# Patient Record
Sex: Male | Born: 1985 | Hispanic: Yes | Marital: Single | Smoking: Current some day smoker
Health system: Southern US, Community
[De-identification: ages and names within clinical notes are randomized; demographics above are authoritative.]

## PROBLEM LIST (undated history)

## (undated) DIAGNOSIS — I10 Essential (primary) hypertension: Secondary | ICD-10-CM

## (undated) DIAGNOSIS — E7800 Pure hypercholesterolemia, unspecified: Secondary | ICD-10-CM

---

## 2018-05-31 ENCOUNTER — Other Ambulatory Visit: Payer: Self-pay

## 2018-05-31 ENCOUNTER — Emergency Department (HOSPITAL_BASED_OUTPATIENT_CLINIC_OR_DEPARTMENT_OTHER): Payer: Self-pay

## 2018-05-31 ENCOUNTER — Encounter (HOSPITAL_BASED_OUTPATIENT_CLINIC_OR_DEPARTMENT_OTHER): Payer: Self-pay | Admitting: *Deleted

## 2018-05-31 DIAGNOSIS — F17200 Nicotine dependence, unspecified, uncomplicated: Secondary | ICD-10-CM | POA: Insufficient documentation

## 2018-05-31 DIAGNOSIS — I10 Essential (primary) hypertension: Secondary | ICD-10-CM | POA: Insufficient documentation

## 2018-05-31 DIAGNOSIS — R002 Palpitations: Secondary | ICD-10-CM | POA: Insufficient documentation

## 2018-05-31 NOTE — ED Triage Notes (Signed)
Pt reports palpitations, chest pain, dizziness, SOB x 1 week. Pt alert. NAD. Using [place] interpreter [phone] for triage

## 2018-06-01 ENCOUNTER — Emergency Department (HOSPITAL_BASED_OUTPATIENT_CLINIC_OR_DEPARTMENT_OTHER): Payer: Self-pay

## 2018-06-01 ENCOUNTER — Emergency Department (HOSPITAL_BASED_OUTPATIENT_CLINIC_OR_DEPARTMENT_OTHER)
Admission: EM | Admit: 2018-06-01 | Discharge: 2018-06-01 | Disposition: A | Discharge status: Home / Self Care | Payer: Self-pay | Attending: Emergency Medicine | Admitting: Emergency Medicine

## 2018-06-01 DIAGNOSIS — R002 Palpitations: Secondary | ICD-10-CM

## 2018-06-01 DIAGNOSIS — I10 Essential (primary) hypertension: Secondary | ICD-10-CM

## 2018-06-01 HISTORY — DX: Pure hypercholesterolemia, unspecified: E78.00

## 2018-06-01 LAB — CBC
HCT: 46.2 % (ref 39.0–52.0)
HEMOGLOBIN: 16.5 g/dL (ref 13.0–17.0)
MCH: 34.7 pg — ABNORMAL HIGH (ref 26.0–34.0)
MCHC: 35.7 g/dL (ref 30.0–36.0)
MCV: 97.1 fL (ref 78.0–100.0)
Platelets: 251 10*3/uL (ref 150–400)
RBC: 4.76 MIL/uL (ref 4.22–5.81)
RDW: 12.6 % (ref 11.5–15.5)
WBC: 7.1 10*3/uL (ref 4.0–10.5)

## 2018-06-01 LAB — BASIC METABOLIC PANEL
ANION GAP: 11 (ref 5–15)
BUN: 10 mg/dL (ref 6–20)
CALCIUM: 9.1 mg/dL (ref 8.9–10.3)
CO2: 26 mmol/L (ref 22–32)
Chloride: 102 mmol/L (ref 98–111)
Creatinine, Ser: 1.02 mg/dL (ref 0.61–1.24)
GFR calc Af Amer: >60 mL/min (ref >60)
GLUCOSE: 128 mg/dL — AB (ref 70–99)
Potassium: 3.8 mmol/L (ref 3.5–5.1)
SODIUM: 139 mmol/L (ref 135–145)

## 2018-06-01 LAB — TROPONIN I
TROPONIN I: 0.04 ng/mL — AB (ref <0.03)
TROPONIN I: 0.04 ng/mL — AB (ref <0.03)

## 2018-06-01 LAB — TSH: TSH: 3.405 u[IU]/mL (ref 0.350–4.500)

## 2018-06-01 MED ORDER — IOPAMIDOL (ISOVUE-370) INJECTION 76%
100.0000 mL | Freq: Once | INTRAVENOUS | Status: AC | PRN
  Administered 2018-06-01: 100 mL via INTRAVENOUS

## 2018-06-01 MED ORDER — MORPHINE SULFATE (PF) 4 MG/ML IV SOLN
4.0000 mg | Freq: Once | INTRAVENOUS | Status: AC
  Administered 2018-06-01: 4 mg via INTRAVENOUS
  Filled 2018-06-01: qty 1

## 2018-06-01 MED ORDER — ONDANSETRON HCL 4 MG/2ML IJ SOLN
4.0000 mg | Freq: Once | INTRAMUSCULAR | Status: AC
  Administered 2018-06-01: 4 mg via INTRAVENOUS
  Filled 2018-06-01: qty 2

## 2018-06-01 MED ORDER — HYDROCHLOROTHIAZIDE 25 MG PO TABS: 25.0000 mg | ORAL_TABLET | Freq: Every day | ORAL | 0 refills | Status: DC

## 2018-06-01 NOTE — ED Provider Notes (Signed)
[facility] EMERGENCY DEPARTMENT Provider Note   CSN: [id number] Arrival date & time: 05/31/18  2333     History   Chief Complaint Chief Complaint  Patient presents with  . Palpitations    HPI Peter Villarreal is a 32 y.o. male.  HPI  This is a 32 year old Hispanic male who presents with palpitations.  Patient reports 1 week history of shortness of breath and palpitations.  Seems to come and go.  Nothing seems to make it better or worse.  Patient does report some increase caffeine use.  Denies any drug or alcohol abuse.  Specifically denies cocaine use.  Denies any recent travel or hospitalization.  No history of blood clots or leg swelling.  States palpitations are over the left side of the chest.  He occasionally has some left arm numbness.  Reports history of hyperlipidemia.  No history of diabetes, smoking, early family history of heart disease.  No known thyroid disorders.  She does report some residual palpitations at this time.  History taken with Spanish interpreter.  Past Medical History:  Diagnosis Date  . High cholesterol     There are no active problems to display for this patient.   History reviewed. No pertinent surgical history.      Home Medications    Prior to Admission medications   Medication Sig Start Date End Date Taking? Authorizing Provider  hydrochlorothiazide (HYDRODIURIL) 25 MG tablet Take 1 tablet (25 mg total) by mouth daily. 06/01/18   Neyra Pettie, Mayer Masker, MD    Family History No family history on file.  Social History Social History   Tobacco Use  . Smoking status: Current Some Day Smoker  . Smokeless tobacco: Never Used  Substance Use Topics  . Alcohol use: Yes    Comment: 2x week  . Drug use: Never     Allergies   Patient has no known allergies.   Review of Systems Review of Systems  Constitutional: Negative for fever.  Respiratory: Positive for shortness of breath. Negative for chest tightness.   Cardiovascular:  Positive for palpitations. Negative for chest pain and leg swelling.  Gastrointestinal: Negative for abdominal pain, nausea and vomiting.  Genitourinary: Negative for dysuria.  Neurological: Positive for dizziness.  All other systems reviewed and are negative.    Physical Exam Updated Vital Signs BP (!) 163/125   Pulse 100   Temp 98.5 F (36.9 C)   Resp (!) 21   Ht 1.626 m (5\' 4" )   Wt 99.8 kg   SpO2 100%   BMI 37.76 kg/m   Physical Exam  Constitutional: He is oriented to person, place, and time. He appears well-developed and well-nourished. No distress.  HENT:  Head: Normocephalic and atraumatic.  Eyes: Pupils are equal, round, and reactive to light.  Cardiovascular: Normal rate, regular rhythm and normal heart sounds.  No murmur heard. Pulmonary/Chest: Effort normal and breath sounds normal. No respiratory distress. He has no wheezes.  Abdominal: Soft. Bowel sounds are normal. There is no tenderness. There is no rebound.  Musculoskeletal: He exhibits no edema or tenderness.  Neurological: He is alert and oriented to person, place, and time.  Skin: Skin is warm and dry.  Psychiatric: He has a normal mood and affect.  Nursing note and vitals reviewed.    ED Treatments / Results  Labs (all labs ordered are listed, but only abnormal results are displayed) Labs Reviewed  BASIC METABOLIC PANEL - Abnormal; Notable for the following components:      Result  Value   Glucose, Bld 128 (*)    All other components within normal limits  CBC - Abnormal; Notable for the following components:   MCH 34.7 (*)    All other components within normal limits  TROPONIN I - Abnormal; Notable for the following components:   Troponin I 0.04 (*)    All other components within normal limits  TROPONIN I - Abnormal; Notable for the following components:   Troponin I 0.04 (*)    All other components within normal limits  TSH    EKG EKG Interpretation  Date/Time:  Sunday May 31 2018  23:56:02 EDT Ventricular Rate:  116 PR Interval:  150 QRS Duration: 94 QT Interval:  336 QTC Calculation: 467 R Axis:   -90 Text Interpretation:  Sinus tachycardia Biatrial enlargement Pulmonary disease pattern Left anterior fascicular block Abnormal ECG NO prior for comparison Confirmed by Ross Marcus ([id number]) on 06/01/2018 1:19:06 AM   Radiology Dg Chest 2 View  Result Date: 06/01/2018 CLINICAL DATA:  Chest pain and palpitations EXAM: CHEST - 2 VIEW COMPARISON:  None. FINDINGS: The heart size and mediastinal contours are within normal limits. Both lungs are clear. The visualized skeletal structures are unremarkable. IMPRESSION: Clear lungs. Electronically Signed   By: Deatra Robinson M.D.   On: 06/01/2018 01:12   Ct Angio Chest Pe W And/or Wo Contrast  Result Date: 06/01/2018 CLINICAL DATA:  PE suspected, high pretest prob. Chest pain, shortness of breath, dizziness and palpitation. EXAM: CT ANGIOGRAPHY CHEST WITH CONTRAST TECHNIQUE: Multidetector CT imaging of the chest was performed using the standard protocol during bolus administration of intravenous contrast. Multiplanar CT image reconstructions and MIPs were obtained to evaluate the vascular anatomy. CONTRAST:  ISOVUE-370 IOPAMIDOL (ISOVUE-370) INJECTION 76% COMPARISON:  Radiograph earlier this day. FINDINGS: Cardiovascular: There are no filling defects within the pulmonary arteries to suggest pulmonary embolus. The thoracic aorta is normal in caliber. Cannot assess for dissection given phase of contrast. Heart is normal in size. No pericardial effusion. Mediastinum/Nodes: No enlarged mediastinal or hilar lymph nodes. The esophagus is slightly patulous. No thyroid nodule. Lungs/Pleura: No consolidation, pulmonary edema or pleural fluid. Mild hypoventilatory change dependently. Upper Abdomen: Hepatic steatosis. Musculoskeletal: There are no acute or suspicious osseous abnormalities. Review of the MIP images confirms the above findings.  IMPRESSION: 1. No pulmonary embolus or acute intrathoracic abnormality. 2. Incidental note of hepatic steatosis. Electronically Signed   By: Narda Rutherford M.D.   On: 06/01/2018 02:37    Procedures Procedures (including critical care time)  Medications Ordered in ED Medications  morphine 4 MG/ML injection 4 mg (4 mg Intravenous Given 06/01/18 0148)  ondansetron (ZOFRAN) injection 4 mg (4 mg Intravenous Given 06/01/18 0148)  iopamidol (ISOVUE-370) 76 % injection 100 mL (100 mLs Intravenous Contrast Given 06/01/18 0212)     Initial Impression / Assessment and Plan / ED Course  I have reviewed the triage vital signs and the nursing notes.  Pertinent labs & imaging results that were available during my care of the patient were reviewed by me and considered in my medical decision making (see chart for details).  Clinical Course as of Jun 01 406  Mon Jun 01, 2018  0340 On recheck, patient states he feels much better.  Vital signs reviewed.  Notable for persistent hypertension.   [CH]    Clinical Course User Index [CH] Yazmine Sorey, Mayer Masker, MD    She presents with palpitations and shortness of breath x1 week.  He is overall nontoxic.  Slightly  tachycardic on initial evaluation as well as hypertensive.  Exam is fairly benign.  Denies risk factors for PE.  However, given shortness of breath and tachycardia into the 110s, would need to rule out PE.  He is fairly low risk for ACS.  Risk factors including hypertension and hyperlipidemia.  Heart score of 1 initially.  Initial troponin is marginally elevated at 0.04.  I am not sure that this truly represents ACS.  May be rate dependent.  We will repeat.  Taking into account initial troponin, heart score is 2.  Patient has remained hemodynamically stable in the emergency room.  Her pulse rate varies from 80-1 05.  Thyroid studies are pending.  PET CT scan is negative for acute pulmonary embolus.  Chest x-ray is reassuring.  Repeat troponin is stable at 0.04.   Doubt ACS.  I am reassured with this work-up.  Recommend close follow-up with cardiology for reevaluation.  After history, exam, and medical workup I feel the patient has been appropriately medically screened and is safe for discharge home. Pertinent diagnoses were discussed with the patient. Patient was given return precautions.   Final Clinical Impressions(s) / ED Diagnoses   Final diagnoses:  Palpitations  Essential hypertension    ED Discharge Orders         Ordered    hydrochlorothiazide (HYDRODIURIL) 25 MG tablet  Daily     06/01/18 0355           Shon Baton, MD 06/01/18 0410

## 2018-06-01 NOTE — ED Notes (Signed)
Pt denies any pain at present.Sinus on monitor.  D/c instructions provided in spanish for patient with opportunity to answer questions via interpreter Voiced understanding.

## 2018-06-01 NOTE — ED Notes (Signed)
Report received 

## 2018-06-01 NOTE — Discharge Instructions (Addendum)
You were seen today for palpitations.  Your work-up is largely reassuring.  You need to follow-up with cardiology for definitive evaluation.  Make sure to reduce your caffeine.  Thyroid testing is pending.

## 2018-06-01 NOTE — ED Notes (Signed)
Patient transported to X-ray 

## 2018-06-01 NOTE — ED Notes (Signed)
Lab called and reports a troponin of 0.04. Dr. Wilkie Aye aware.

## 2018-09-18 ENCOUNTER — Encounter (HOSPITAL_COMMUNITY): Payer: Self-pay

## 2018-09-18 ENCOUNTER — Other Ambulatory Visit: Payer: Self-pay

## 2018-09-18 ENCOUNTER — Emergency Department (HOSPITAL_COMMUNITY): Payer: Self-pay

## 2018-09-18 ENCOUNTER — Emergency Department (HOSPITAL_COMMUNITY)
Admission: EM | Admit: 2018-09-18 | Discharge: 2018-09-18 | Disposition: A | Discharge status: Home / Self Care | Payer: Self-pay | Attending: Emergency Medicine | Admitting: Emergency Medicine

## 2018-09-18 DIAGNOSIS — R002 Palpitations: Secondary | ICD-10-CM | POA: Insufficient documentation

## 2018-09-18 DIAGNOSIS — F17200 Nicotine dependence, unspecified, uncomplicated: Secondary | ICD-10-CM | POA: Insufficient documentation

## 2018-09-18 DIAGNOSIS — F1020 Alcohol dependence, uncomplicated: Secondary | ICD-10-CM | POA: Insufficient documentation

## 2018-09-18 DIAGNOSIS — I10 Essential (primary) hypertension: Secondary | ICD-10-CM | POA: Insufficient documentation

## 2018-09-18 HISTORY — DX: Essential (primary) hypertension: I10

## 2018-09-18 LAB — I-STAT VENOUS BLOOD GAS, ED
ACID-BASE EXCESS: 7 mmol/L — AB (ref 0.0–2.0)
BICARBONATE: 32.4 mmol/L — AB (ref 20.0–28.0)
O2 Saturation: 86 %
PCO2 VEN: 46.3 mmHg (ref 44.0–60.0)
PH VEN: 7.453 — AB (ref 7.250–7.430)
TCO2: 34 mmol/L — ABNORMAL HIGH (ref 22–32)
pO2, Ven: 49 mmHg — ABNORMAL HIGH (ref 32.0–45.0)

## 2018-09-18 LAB — BASIC METABOLIC PANEL
Anion gap: 18 — ABNORMAL HIGH (ref 5–15)
BUN: 13 mg/dL (ref 6–20)
CO2: 19 mmol/L — AB (ref 22–32)
Calcium: 9.3 mg/dL (ref 8.9–10.3)
Chloride: 98 mmol/L (ref 98–111)
Creatinine, Ser: 1.04 mg/dL (ref 0.61–1.24)
GFR calc Af Amer: >60 mL/min (ref >60)
GFR calc non Af Amer: >60 mL/min (ref >60)
GLUCOSE: 107 mg/dL — AB (ref 70–99)
Potassium: 3.8 mmol/L (ref 3.5–5.1)
Sodium: 135 mmol/L (ref 135–145)

## 2018-09-18 LAB — CBC
HEMATOCRIT: 46.5 % (ref 39.0–52.0)
Hemoglobin: 16.9 g/dL (ref 13.0–17.0)
MCH: 34.4 pg — AB (ref 26.0–34.0)
MCHC: 36.3 g/dL — AB (ref 30.0–36.0)
MCV: 94.7 fL (ref 80.0–100.0)
Platelets: 267 10*3/uL (ref 150–400)
RBC: 4.91 MIL/uL (ref 4.22–5.81)
RDW: 11.9 % (ref 11.5–15.5)
WBC: 9.3 10*3/uL (ref 4.0–10.5)
nRBC: 0 % (ref 0.0–0.2)

## 2018-09-18 LAB — I-STAT TROPONIN, ED
TROPONIN I, POC: 0.02 ng/mL (ref 0.00–0.08)
Troponin i, poc: 0.01 ng/mL (ref 0.00–0.08)

## 2018-09-18 MED ORDER — SODIUM CHLORIDE 0.9 % IV BOLUS
1000.0000 mL | Freq: Once | INTRAVENOUS | Status: AC
  Administered 2018-09-18: 1000 mL via INTRAVENOUS

## 2018-09-18 MED ORDER — HYDROCHLOROTHIAZIDE 25 MG PO TABS: 25.0000 mg | ORAL_TABLET | Freq: Every day | ORAL | 1 refills | Status: AC

## 2018-09-18 MED ORDER — LORAZEPAM 1 MG PO TABS
1.0000 mg | ORAL_TABLET | Freq: Once | ORAL | Status: AC
  Administered 2018-09-18: 1 mg via ORAL
  Filled 2018-09-18: qty 1

## 2018-09-18 NOTE — ED Provider Notes (Signed)
[facility] EMERGENCY DEPARTMENT Provider Note   CSN: [id number] Arrival date & time: 09/18/18  0127     History   Chief Complaint Chief Complaint  Patient presents with  . Chest Pain    HPI Peter Villarreal is a 32 y.o. male with a history of hyperlipidemia and hypertension who presents to the emergency department with a chief complaint of palpitations.  The patient endorses intermittent episodes of palpitations with dizziness and left shoulder and arm numbness.  Reports that his episode began last night after drinking coffee. He states he was told previously to avoid coffee because it can cause his heart to beat fast, but he was tired during a 6-hour drive. States that when the episodes come on that he feels very "anxious like I'm trembling on the inside." He took one tablet of HCTZ after his symptoms began, and the palpitations have since resolved.  He also reports he has been having constant left-sided chest pain that he states feels like a "pinprick" with shortness of breath for the last 3-4 months. Eating makes the pain worse. Exercise improves the pain.  He states that he feels as if his symptoms are worse at night.  Pain is not pleuritic or worse with exertion.  States that he drinks approximately 10-12 beers on the weeks and 6-7 bottles of beer daily during the week. He has occasional marijuana use, approximately once per month. Denies cocaine or other recreational drug use. Last drink was 12/24.  No history of seizures or DTs or complicated withdrawal.  No family history of heart disease.  Reports his grandmother had a stroke.  He is not established with primary care.  No history of PE or DVT.  The history is provided by the patient. A language interpreter was used ([place]).    Past Medical History:  Diagnosis Date  . High cholesterol   . Hypertension     There are no active problems to display for this patient.   History reviewed. No pertinent surgical  history.      Home Medications    Prior to Admission medications   Medication Sig Start Date End Date Taking? Authorizing Provider  hydrochlorothiazide (HYDRODIURIL) 25 MG tablet Take 1 tablet (25 mg total) by mouth daily. 09/18/18   Fidencia Mccloud, Coral Else, PA-C    Family History History reviewed. No pertinent family history.  Social History Social History   Tobacco Use  . Smoking status: Current Some Day Smoker  . Smokeless tobacco: Never Used  Substance Use Topics  . Alcohol use: Yes    Comment: 10-12 beers on weekends and 6-7 beers on week days  . Drug use: Yes    Types: Marijuana    Comment: 1x month     Allergies   Patient has no known allergies.   Review of Systems Review of Systems  Constitutional: Negative for appetite change, chills, fatigue and fever.  HENT: Negative for congestion.   Eyes: Negative for visual disturbance.  Respiratory: Positive for shortness of breath. Negative for cough and wheezing.   Cardiovascular: Positive for chest pain and palpitations. Negative for leg swelling.  Gastrointestinal: Positive for nausea. Negative for abdominal pain, constipation, diarrhea and vomiting.  Genitourinary: Negative for dysuria.  Musculoskeletal: Negative for back pain.  Skin: Negative for rash.  Allergic/Immunologic: Negative for immunocompromised state.  Neurological: Positive for dizziness and numbness. Negative for tremors, seizures, syncope, speech difficulty, weakness, light-headedness and headaches.  Psychiatric/Behavioral: Negative for confusion. The patient is nervous/anxious.  Physical Exam Updated Vital Signs BP (!) 160/117   Pulse 95   Temp 98.4 F (36.9 C) (Oral)   Resp 19   SpO2 95%   Physical Exam Vitals signs and nursing note reviewed.  Constitutional:      General: He is not in acute distress.    Appearance: He is well-developed. He is not ill-appearing or toxic-appearing.     Comments: Resting comfortably. NAD.   HENT:     Head:  Normocephalic.  Eyes:     Conjunctiva/sclera: Conjunctivae normal.  Neck:     Musculoskeletal: Neck supple.  Cardiovascular:     Rate and Rhythm: Normal rate and regular rhythm.     Pulses: Normal pulses.     Heart sounds: No murmur. No friction rub. No gallop.   Pulmonary:     Effort: Pulmonary effort is normal. No respiratory distress.     Breath sounds: Normal breath sounds. No stridor. No wheezing, rhonchi or rales.  Chest:     Chest wall: No tenderness.  Abdominal:     General: There is no distension.     Palpations: Abdomen is soft. There is no mass.     Tenderness: There is no abdominal tenderness.     Hernia: No hernia is present.  Musculoskeletal:        General: No swelling or tenderness.     Right lower leg: No edema.     Left lower leg: No edema.  Skin:    General: Skin is warm and dry.     Capillary Refill: Capillary refill takes less than 2 seconds.     Coloration: Skin is not jaundiced.  Neurological:     Mental Status: He is alert.  Psychiatric:        Behavior: Behavior normal.    ED Treatments / Results  Labs (all labs ordered are listed, but only abnormal results are displayed) Labs Reviewed  BASIC METABOLIC PANEL - Abnormal; Notable for the following components:      Result Value   CO2 19 (*)    Glucose, Bld 107 (*)    Anion gap 18 (*)    All other components within normal limits  CBC - Abnormal; Notable for the following components:   MCH 34.4 (*)    MCHC 36.3 (*)    All other components within normal limits  I-STAT VENOUS BLOOD GAS, ED - Abnormal; Notable for the following components:   pH, Ven 7.453 (*)    pO2, Ven 49.0 (*)    Bicarbonate 32.4 (*)    TCO2 34 (*)    Acid-Base Excess 7.0 (*)    All other components within normal limits  I-STAT TROPONIN, ED  I-STAT TROPONIN, ED    EKG EKG Interpretation  Date/Time:  Friday September 18 2018 01:36:32 EST Ventricular Rate:  98 PR Interval:  160 QRS Duration: 102 QT Interval:  380 QTC  Calculation: 485 R Axis:   -102 Text Interpretation:  Poor data quality, interpretation may be adversely affected Normal sinus rhythm Possible Left atrial enlargement Right superior axis deviation Left ventricular hypertrophy Prolonged QT Abnormal ECG When compared with ECG of 05/31/2018, No significant change was found Confirmed by Dione BoozeGlick, David ([id number]) on 09/18/2018 1:59:49 AM   Radiology Dg Chest 2 View  Result Date: 09/18/2018 CLINICAL DATA:  Chest pain for 1 day EXAM: CHEST - 2 VIEW COMPARISON:  06/01/2018 FINDINGS: The heart size and mediastinal contours are within normal limits. Both lungs are clear. The visualized skeletal structures are  unremarkable. IMPRESSION: No active cardiopulmonary disease. Electronically Signed   By: Alcide CleverMark  Lukens M.D.   On: 09/18/2018 02:05    Procedures Procedures (including critical care time)  Medications Ordered in ED Medications  sodium chloride 0.9 % bolus 1,000 mL (0 mLs Intravenous Stopped 09/18/18 0913)  LORazepam (ATIVAN) tablet 1 mg (1 mg Oral Given 09/18/18 [id number])     Initial Impression / Assessment and Plan / ED Course  I have reviewed the triage vital signs and the nursing notes.  Pertinent labs & imaging results that were available during my care of the patient were reviewed by me and considered in my medical decision making (see chart for details).     32 year old with a history of hyperlipidemia and hypertension presenting with intermittent palpitations, dizziness, and intermittent left upper extremity numbness.  He was seen for similar in September 2019. On arrival, the patient was hypertensive and minimally tachycardic.  Blood pressure has since improved to the 150s/160s systolically.  The patient has a history of heavy, daily alcohol use.  Last drink was 48-72 hours ago.  No history of complicated withdrawals, DTs, or seizures.  States his episode of palpitations began after drinking coffee last night. It is possible that caffeine could  have exacerbated his symptoms.  The patient was discussed with Dr. Lynelle DoctorKnapp, attending physician.  Low suspicion for ACS.  Heart score is 1.  Chest x-ray is unremarkable.  EKG with left atrial enlargement and sinus rhythm.  Labs are notable for bicarb of 19 and anion gap of 18.  VBG with pH of 7.453.  No evidence of alcoholic ketoacidosis.  He was hydrated with IV fluids and given Ativan in the ED.  On reexamination, his symptoms have significantly improved and he is feeling much better.  Low suspicion for esophageal rupture, pericarditis, myocarditis, pneumonia, or PE at this time.  Using interpreter, I discussed at length the patient should discontinue drinking alcohol or significantly cut back.  I also discussed he should follow-up with a primary care provider to have his blood pressure rechecked.  He has been given a refill for HCTZ.  He is also been given strict return precautions to the emergency department.  He is hemodynamically stable and in no acute distress.  He is safe for discharge to home with outpatient follow-up at this time.  Final Clinical Impressions(s) / ED Diagnoses   Final diagnoses:  Palpitations  Severe alcohol use disorder [facility](HCC)    ED Discharge Orders         Ordered    hydrochlorothiazide (HYDRODIURIL) 25 MG tablet  Daily     09/18/18 1013           Zen Cedillos, Coral ElseMia A, PA-C 09/18/18 1035    Linwood DibblesKnapp, Jon, MD 09/21/18 0830

## 2018-09-18 NOTE — ED Triage Notes (Signed)
Pt here for chest pain for the last day.  Central non radiating chest pain. Pt A&Ox4 speaks mostly spanish

## 2018-09-18 NOTE — Discharge Instructions (Addendum)
°  Gracias por permitirme cuidarlo hoy en el [facility] [address]  Llame al nmero que figura en su papeleo de alta para establecerse con un proveedor de atencin primaria. Pueden ayudar a Scientist, physiologicalcontrolar la presin arterial y Print production plannerel colesterol.  Sospecho que algunos de sus sntomas hoy se debieron a la cantidad de alcohol que toma. A veces, cuando bebes mucho alcohol y pasas [address] das sin beber, tu cuerpo puede comenzar a retraerse y puede causar sntomas similares a los que experimentaste hoy. Reduzca la cantidad de alcohol que bebe. Recomendara dejar de beber alcohol por completo.  Tome 1 tableta de hidroclorotiazida por va oral diariamente. Intente programar una cita de seguimiento con atencin primaria dentro de las prximas 2 semanas para volver a Chief Operating Officercontrolar su presin arterial.  Regrese al departamento de emergencias si desarrolla dolor en el pecho con sudoracin, falta de aliento severa, fiebre alta, si se desmaya, tiene [place] similar a una convulsin u otros sntomas nuevos relacionados.  Thank you for allowing me to care for you today in the Emergency Department.   Please call the number on your discharge paperwork to get established with a primary care provider. They can help get your blood pressure and cholesterol under control.  I suspect some of your symptoms today were due to the amount of alcohol you drink. Sometimes when you drink a lot of alcohol then go for a few days without drinking your body can start to go into withdrawal and can cause symptoms similar to what you were experiencing today. Please cut back on the amount of alcohol you drink. I would recommend stop drinking alcohol completely.   Take 1 tablet of hydrochlorothiazide by mouth daily.  Please try to schedule a follow-up appointment with primary care within the next 2 weeks to have your blood pressure rechecked.  Return to the emergency department if you develop chest pain with sweating, severe shortness of breath,  high fever, if you pass out, have seizure-like activity, or other new, concerning symptoms.

## 2020-05-07 IMAGING — DX DG CHEST 2V
2 series · 2 of 2 positions shown · non-contrast
Comparison: None.

CLINICAL DATA: Chest pain and palpitations

EXAM:
CHEST - 2 VIEW

[chest pa]
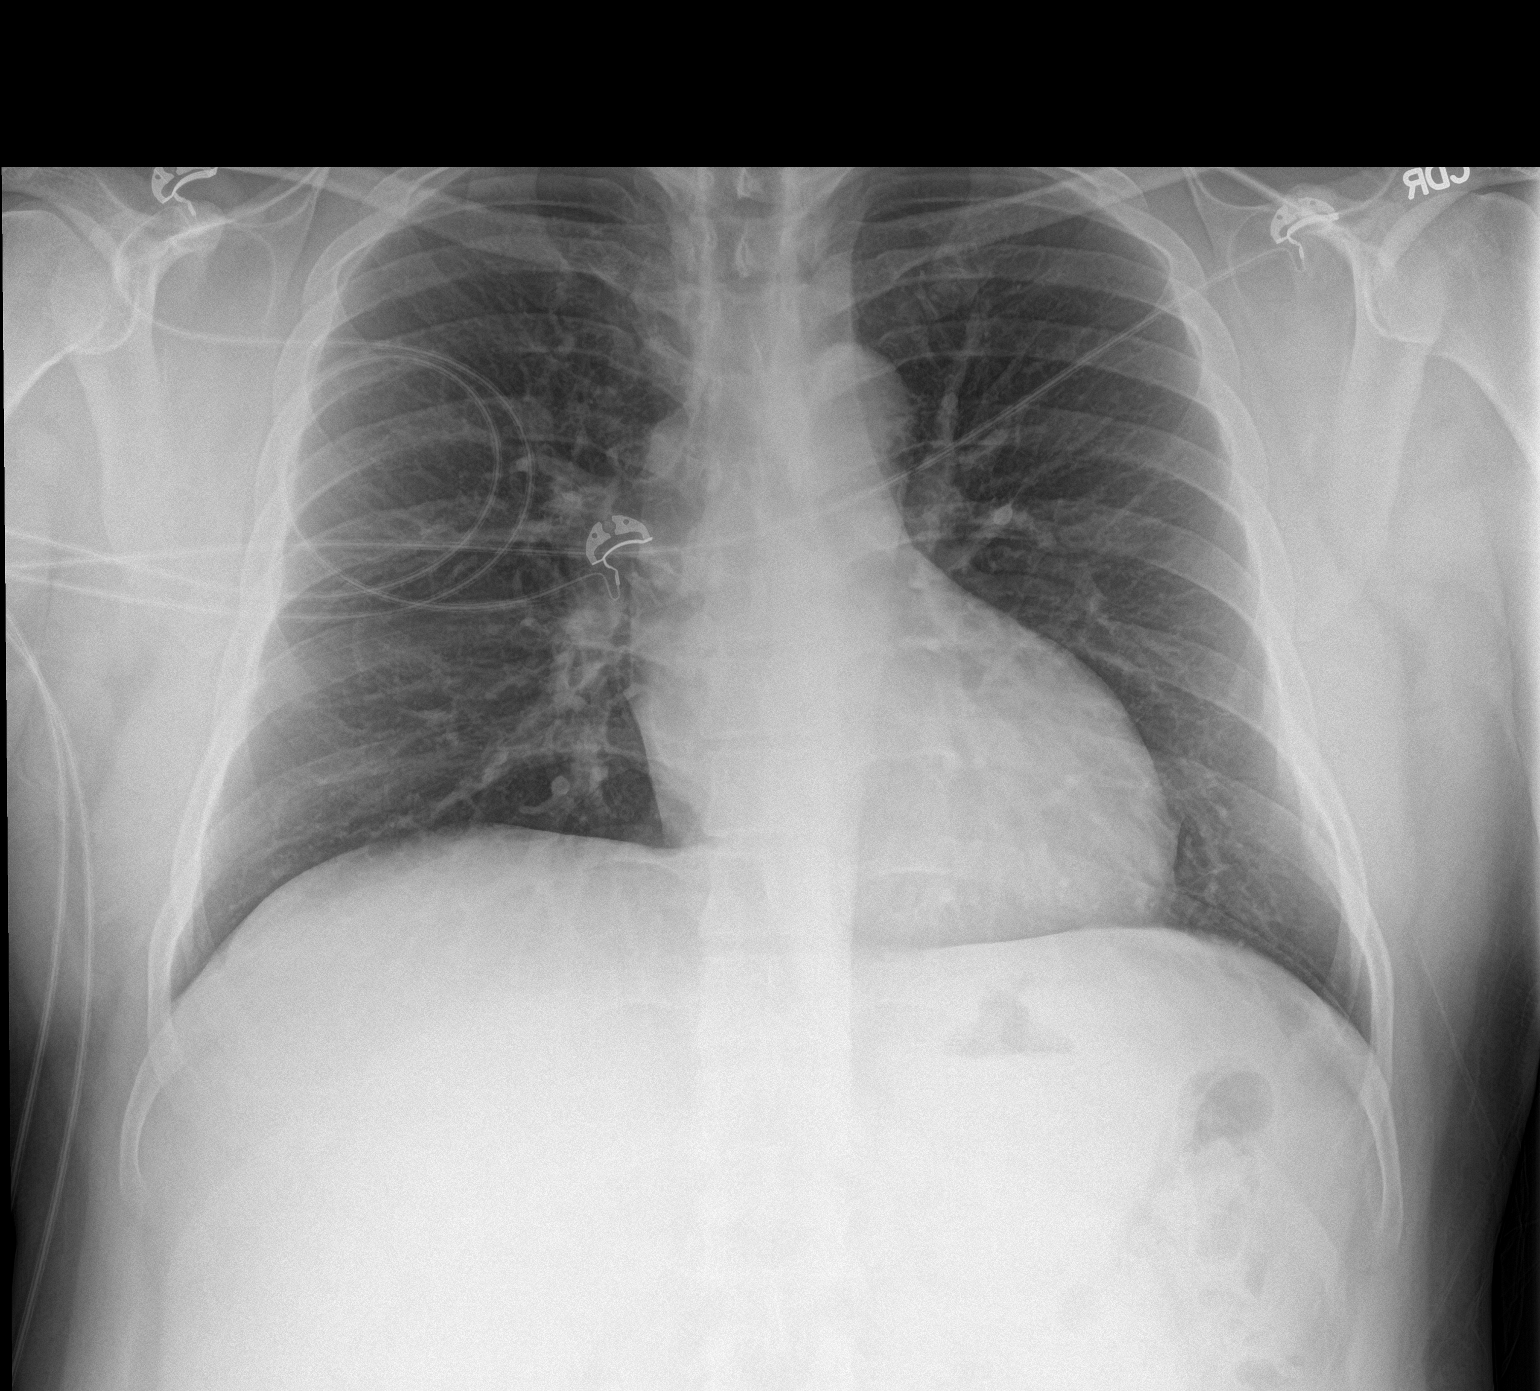

[chest lat]
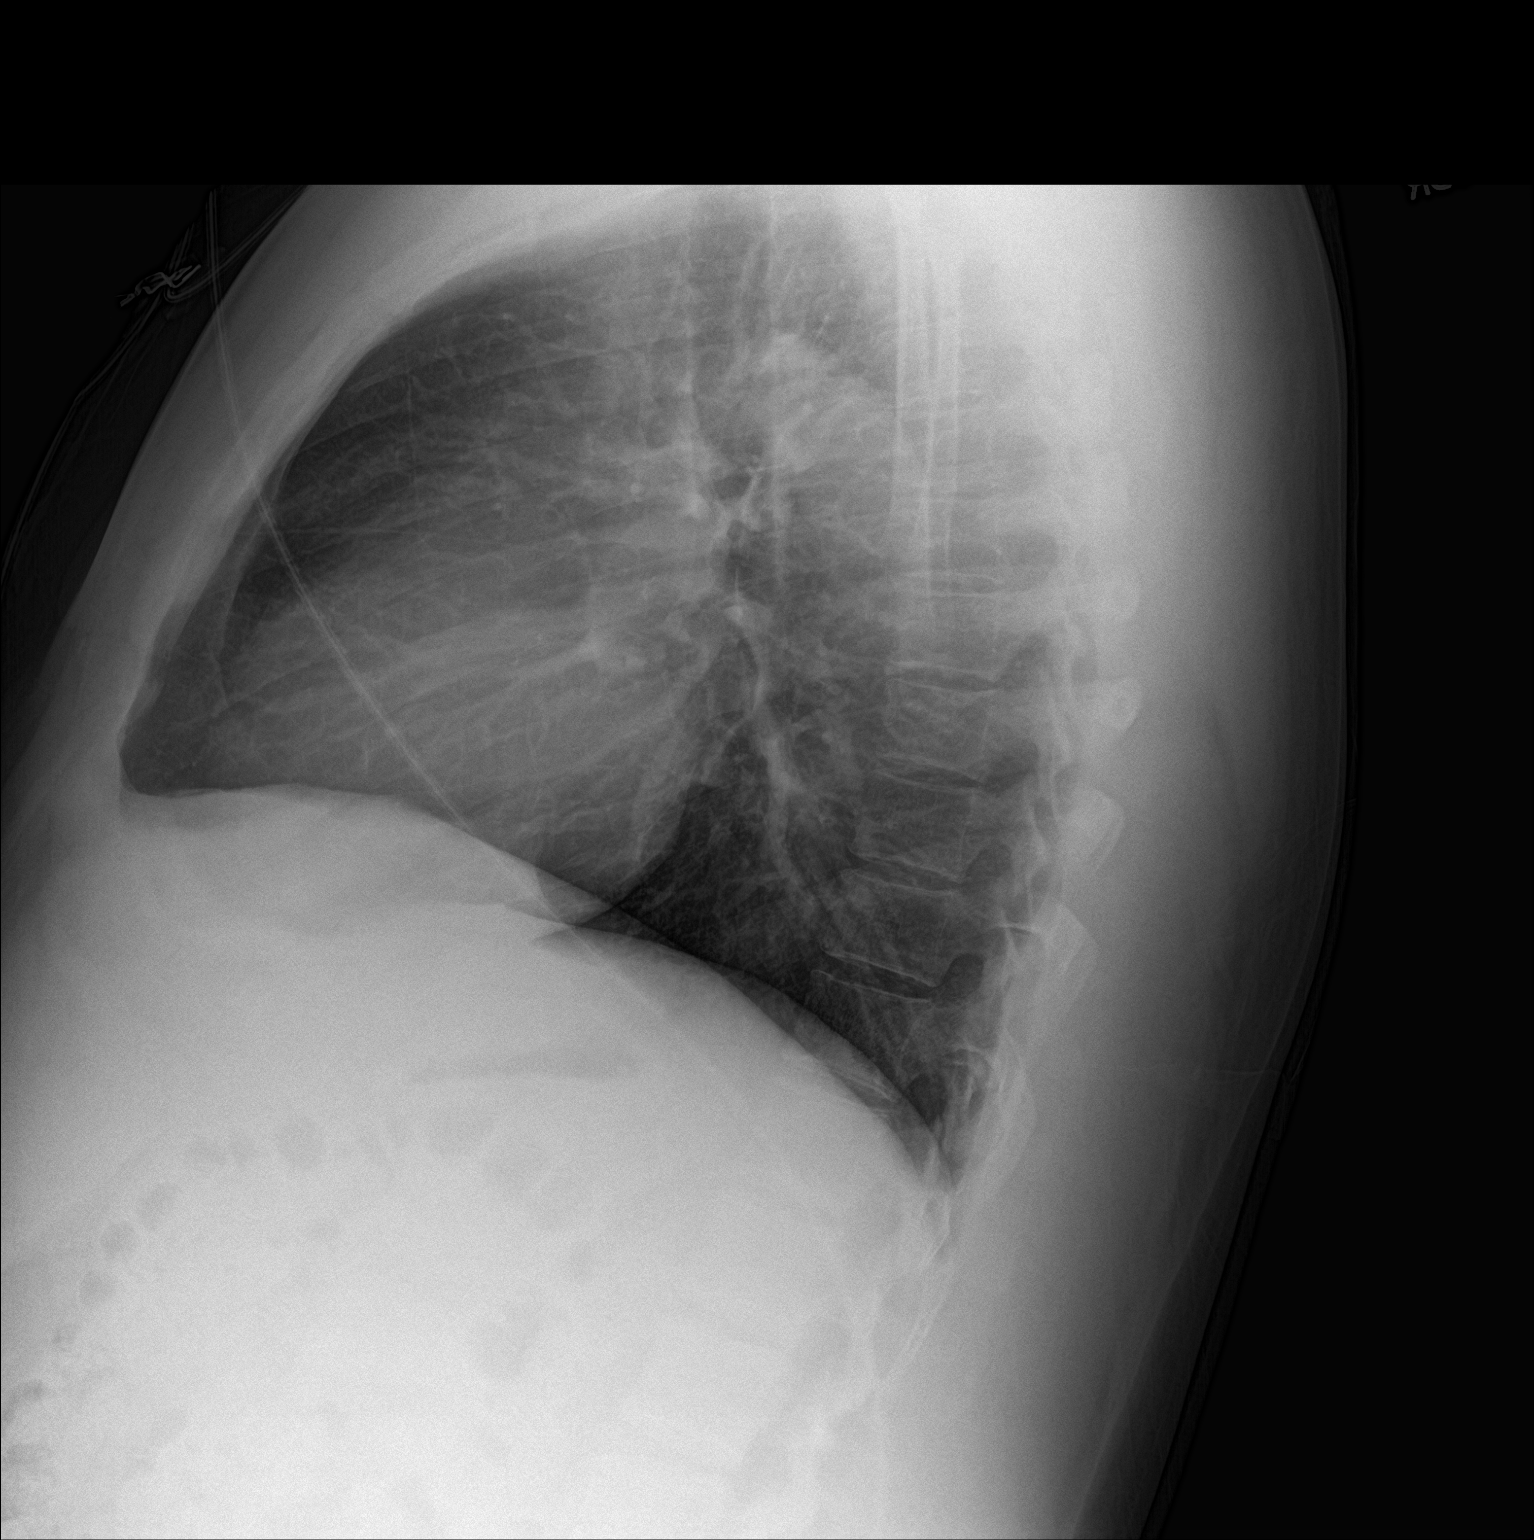

[2 of 2 positions shown; findings below may reference images not displayed]

FINDINGS: The heart size and mediastinal contours are within normal limits.
Both lungs are clear. The visualized skeletal structures are
unremarkable.
IMPRESSION: Clear lungs.
